# Patient Record
Sex: Female | Born: 1990 | Race: White | Hispanic: No | Marital: Single | Smoking: Never smoker
Health system: Southern US, Community
[De-identification: ages and names within clinical notes are randomized; demographics above are authoritative.]

## PROBLEM LIST (undated history)

## (undated) DIAGNOSIS — F909 Attention-deficit hyperactivity disorder, unspecified type: Secondary | ICD-10-CM

## (undated) DIAGNOSIS — I471 Supraventricular tachycardia, unspecified: Secondary | ICD-10-CM

## (undated) DIAGNOSIS — R42 Dizziness and giddiness: Secondary | ICD-10-CM

## (undated) DIAGNOSIS — N200 Calculus of kidney: Secondary | ICD-10-CM

---

## 2021-11-08 ENCOUNTER — Other Ambulatory Visit: Payer: Self-pay

## 2021-11-08 ENCOUNTER — Encounter (HOSPITAL_BASED_OUTPATIENT_CLINIC_OR_DEPARTMENT_OTHER): Payer: Self-pay

## 2021-11-08 ENCOUNTER — Emergency Department (HOSPITAL_BASED_OUTPATIENT_CLINIC_OR_DEPARTMENT_OTHER)
Admission: EM | Admit: 2021-11-08 | Discharge: 2021-11-08 | Disposition: A | Discharge status: Home / Self Care | Payer: 59 | Attending: Emergency Medicine | Admitting: Emergency Medicine

## 2021-11-08 ENCOUNTER — Emergency Department (HOSPITAL_BASED_OUTPATIENT_CLINIC_OR_DEPARTMENT_OTHER): Payer: 59

## 2021-11-08 DIAGNOSIS — R1031 Right lower quadrant pain: Secondary | ICD-10-CM | POA: Diagnosis present

## 2021-11-08 DIAGNOSIS — N201 Calculus of ureter: Secondary | ICD-10-CM | POA: Diagnosis not present

## 2021-11-08 HISTORY — DX: Calculus of kidney: N20.0

## 2021-11-08 LAB — CBC WITH DIFFERENTIAL/PLATELET
Abs Immature Granulocytes: 0.05 10*3/uL (ref 0.00–0.07)
Basophils Absolute: 0 10*3/uL (ref 0.0–0.1)
Basophils Relative: 0 %
Eosinophils Absolute: 0 10*3/uL (ref 0.0–0.5)
Eosinophils Relative: 0 %
HCT: 35.2 % — ABNORMAL LOW (ref 36.0–46.0)
Hemoglobin: 11.9 g/dL — ABNORMAL LOW (ref 12.0–15.0)
Immature Granulocytes: 0 %
Lymphocytes Relative: 7 %
Lymphs Abs: 0.8 10*3/uL (ref 0.7–4.0)
MCH: 29.5 pg (ref 26.0–34.0)
MCHC: 33.8 g/dL (ref 30.0–36.0)
MCV: 87.3 fL (ref 80.0–100.0)
Monocytes Absolute: 0.3 10*3/uL (ref 0.1–1.0)
Monocytes Relative: 2 %
Neutro Abs: 10.1 10*3/uL — ABNORMAL HIGH (ref 1.7–7.7)
Neutrophils Relative %: 91 %
Platelets: 253 10*3/uL (ref 150–400)
RBC: 4.03 MIL/uL (ref 3.87–5.11)
RDW: 12.2 % (ref 11.5–15.5)
WBC: 11.3 10*3/uL — ABNORMAL HIGH (ref 4.0–10.5)
nRBC: 0 % (ref 0.0–0.2)

## 2021-11-08 LAB — COMPREHENSIVE METABOLIC PANEL
ALT: 12 U/L (ref 0–44)
AST: 19 U/L (ref 15–41)
Albumin: 4 g/dL (ref 3.5–5.0)
Alkaline Phosphatase: 85 U/L (ref 38–126)
Anion gap: 8 (ref 5–15)
BUN: 19 mg/dL (ref 6–20)
CO2: 21 mmol/L — ABNORMAL LOW (ref 22–32)
Calcium: 8.8 mg/dL — ABNORMAL LOW (ref 8.9–10.3)
Chloride: 110 mmol/L (ref 98–111)
Creatinine, Ser: 0.96 mg/dL (ref 0.44–1.00)
GFR, Estimated: >60 mL/min (ref >60)
Glucose, Bld: 90 mg/dL (ref 70–99)
Potassium: 3.7 mmol/L (ref 3.5–5.1)
Sodium: 139 mmol/L (ref 135–145)
Total Bilirubin: 0.6 mg/dL (ref 0.3–1.2)
Total Protein: 6.9 g/dL (ref 6.5–8.1)

## 2021-11-08 LAB — URINALYSIS, ROUTINE W REFLEX MICROSCOPIC
Bilirubin Urine: NEGATIVE
Glucose, UA: NEGATIVE mg/dL
Ketones, ur: 15 mg/dL — AB
Leukocytes,Ua: NEGATIVE
Nitrite: NEGATIVE
Protein, ur: NEGATIVE mg/dL
Specific Gravity, Urine: >=1.03 (ref 1.005–1.030)
pH: 6 (ref 5.0–8.0)

## 2021-11-08 LAB — PREGNANCY, URINE: Preg Test, Ur: NEGATIVE

## 2021-11-08 LAB — URINALYSIS, MICROSCOPIC (REFLEX)

## 2021-11-08 LAB — LIPASE, BLOOD: Lipase: 28 U/L (ref 11–51)

## 2021-11-08 MED ORDER — SENNOSIDES-DOCUSATE SODIUM 8.6-50 MG PO TABS: 1.0000 | ORAL_TABLET | Freq: Every evening | ORAL | 0 refills | Status: AC | PRN

## 2021-11-08 MED ORDER — KETOROLAC TROMETHAMINE 30 MG/ML IJ SOLN
30.0000 mg | Freq: Once | INTRAMUSCULAR | Status: AC
  Administered 2021-11-08: 30 mg via INTRAVENOUS
  Filled 2021-11-08: qty 1

## 2021-11-08 MED ORDER — OXYCODONE-ACETAMINOPHEN 5-325 MG PO TABS: 1.0000 | ORAL_TABLET | Freq: Four times a day (QID) | ORAL | 0 refills | Status: AC | PRN

## 2021-11-08 MED ORDER — SODIUM CHLORIDE 0.9 % IV BOLUS
1000.0000 mL | Freq: Once | INTRAVENOUS | Status: AC
  Administered 2021-11-08: 1000 mL via INTRAVENOUS

## 2021-11-08 MED ORDER — HYDROMORPHONE HCL 1 MG/ML IJ SOLN
1.0000 mg | Freq: Once | INTRAMUSCULAR | Status: AC
  Administered 2021-11-08: 1 mg via INTRAVENOUS
  Filled 2021-11-08: qty 1

## 2021-11-08 MED ORDER — TAMSULOSIN HCL 0.4 MG PO CAPS: 0.4000 mg | ORAL_CAPSULE | Freq: Every day | ORAL | 0 refills | Status: AC

## 2021-11-08 MED ORDER — IOHEXOL 300 MG/ML  SOLN
100.0000 mL | Freq: Once | INTRAMUSCULAR | Status: AC | PRN
  Administered 2021-11-08: 100 mL via INTRAVENOUS

## 2021-11-08 MED ORDER — ONDANSETRON HCL 4 MG/2ML IJ SOLN
4.0000 mg | Freq: Once | INTRAMUSCULAR | Status: AC
  Administered 2021-11-08: 4 mg via INTRAVENOUS
  Filled 2021-11-08: qty 2

## 2021-11-08 MED ORDER — IBUPROFEN 800 MG PO TABS: 800.0000 mg | ORAL_TABLET | Freq: Three times a day (TID) | ORAL | 0 refills | Status: AC | PRN

## 2021-11-08 NOTE — ED Notes (Signed)
Pt resting in room, no complaints at this time

## 2021-11-08 NOTE — Discharge Instructions (Signed)

## 2021-11-08 NOTE — ED Triage Notes (Signed)
Pt reports right flank pain begins right lower abdomen radiates around to back beginning 3pm today.  Reports hx kidney stones.

## 2021-11-08 NOTE — ED Provider Notes (Signed)
Emergency Department Provider Note   I have reviewed the triage vital signs and the nursing notes.   HISTORY  Chief Complaint Flank Pain (Hx kidney stones)   HPI Heidi Wagner is a 30 y.o. female prior history of multiple kidney stones presents emergency department with right lower quadrant abdominal pain.  Symptoms began abruptly at 3 PM today and woke her from sleep.  No fevers or chills.  She notes some blood in the urine but notes she is on her menstrual cycle.  No fever.  Pain does radiate into the flank.  Pain is intermittently severe without obvious modifying factor.  Pain ultimately became severe to the point of requiring ED evaluation.   Past Medical History:  Diagnosis Date   Kidney stone     There are no problems to display for this patient.   History reviewed. No pertinent surgical history.  Allergies Patient has no known allergies.  History reviewed. No pertinent family history.  Social History Social History   Tobacco Use   Smoking status: Never   Smokeless tobacco: Never  Substance Use Topics   Alcohol use: Not Currently   Drug use: Not Currently    Review of Systems  Constitutional: No fever/chills Eyes: No visual changes. ENT: No sore throat. Cardiovascular: Denies chest pain. Respiratory: Denies shortness of breath. Gastrointestinal: Positive RLQ abdominal pain.  No nausea, no vomiting.  No diarrhea.  No constipation. Genitourinary: Negative for dysuria. Positive hematuria.  Musculoskeletal: Negative for back pain. Skin: Negative for rash. Neurological: Negative for headaches, focal weakness or numbness.  10-point ROS otherwise negative.  ____________________________________________   PHYSICAL EXAM:  VITAL SIGNS: ED Triage Vitals  Enc Vitals Group     BP 11/08/21 1828 (!) 98/58     Pulse Rate 11/08/21 1828 60     Resp 11/08/21 1828 16     Temp 11/08/21 1828 98 F (36.7 C)     Temp Source 11/08/21 1828 Oral     SpO2 11/08/21  1828 100 %     Weight 11/08/21 1825 120 lb (54.4 kg)     Height 11/08/21 1825 5\' 2"  (1.575 m)   Constitutional: Alert and oriented. Well appearing and in no acute distress. Eyes: Conjunctivae are normal.  Head: Atraumatic. Nose: No congestion/rhinnorhea. Mouth/Throat: Mucous membranes are moist.  Neck: No stridor.   Cardiovascular: Normal rate, regular rhythm. Good peripheral circulation. Grossly normal heart sounds.   Respiratory: Normal respiratory effort.  No retractions. Lungs CTAB. Gastrointestinal: Soft with focal, mild tenderness in the RLQ. No rebound or guarding. No focal tenderness in other abdominal quadrants. No distention.  Musculoskeletal: No lower extremity tenderness nor edema. No gross deformities of extremities. Neurologic:  Normal speech and language. No gross focal neurologic deficits are appreciated.  Skin:  Skin is warm, dry and intact. No rash noted.   ____________________________________________   LABS (all labs ordered are listed, but only abnormal results are displayed)  Labs Reviewed  URINE CULTURE - Abnormal; Notable for the following components:      Result Value   Culture MULTIPLE SPECIES PRESENT, SUGGEST RECOLLECTION (*)    All other components within normal limits  URINALYSIS, ROUTINE W REFLEX MICROSCOPIC - Abnormal; Notable for the following components:   Hgb urine dipstick MODERATE (*)    Ketones, ur 15 (*)    All other components within normal limits  URINALYSIS, MICROSCOPIC (REFLEX) - Abnormal; Notable for the following components:   Bacteria, UA FEW (*)    All other components within normal  limits  CBC WITH DIFFERENTIAL/PLATELET - Abnormal; Notable for the following components:   WBC 11.3 (*)    Hemoglobin 11.9 (*)    HCT 35.2 (*)    Neutro Abs 10.1 (*)    All other components within normal limits  COMPREHENSIVE METABOLIC PANEL - Abnormal; Notable for the following components:   CO2 21 (*)    Calcium 8.8 (*)    All other components  within normal limits  PREGNANCY, URINE  LIPASE, BLOOD  CBC WITH DIFFERENTIAL/PLATELET   ____________________________________________  RADIOLOGY  CT renal reviewed.   ____________________________________________   PROCEDURES  Procedure(s) performed:   Procedures  None ____________________________________________   INITIAL IMPRESSION / ASSESSMENT AND PLAN / ED COURSE  Pertinent labs & imaging results that were available during my care of the patient were reviewed by me and considered in my medical decision making (see chart for details).   Patient presents emergency department right lower quadrant abdominal pain with some focal tenderness.  Seems most consistent with kidney stone to the patient with her past presentations.  With focal tenderness, also considering acute appendicitis versus ovarian torsion with lower suspicion for these diagnoses.  Plan for CT with IV contrast and labs.  Differential diagnosis includes but is not exclusive to ectopic pregnancy, ovarian cyst, ovarian torsion, acute appendicitis, urinary tract infection, endometriosis, bowel obstruction, hernia, colitis, renal colic, gastroenteritis, volvulus etc.   Labs reviewed. No AKI. Minimal leukocytosis. UA with no clear evidence of UTI. Will send for culture. 26mm UVJ stone on the right corresponds with the patient's symptoms. Pain is well controlled here. Reviewed the [place] drug database and prescribed medication for pain and ureteral stone. Provided info for Urology follow up.  ____________________________________________  FINAL CLINICAL IMPRESSION(S) / ED DIAGNOSES  Final diagnoses:  Ureteral stone     MEDICATIONS GIVEN DURING THIS VISIT:  Medications  ondansetron (ZOFRAN) injection 4 mg (4 mg Intravenous Given 11/08/21 2039)  ketorolac (TORADOL) 30 MG/ML injection 30 mg (30 mg Intravenous Given 11/08/21 2038)  HYDROmorphone (DILAUDID) injection 1 mg (1 mg Intravenous Given 11/08/21 2039)  sodium  chloride 0.9 % bolus 1,000 mL (0 mLs Intravenous Stopped 11/08/21 2222)  iohexol (OMNIPAQUE) 300 MG/ML solution 100 mL (100 mLs Intravenous Contrast Given 11/08/21 2147)  HYDROmorphone (DILAUDID) injection 1 mg (1 mg Intravenous Given 11/08/21 2157)     NEW OUTPATIENT MEDICATIONS STARTED DURING THIS VISIT:  Discharge Medication List as of 11/08/2021 10:59 PM     START taking these medications   Details  ibuprofen (ADVIL) 800 MG tablet Take 1 tablet (800 mg total) by mouth every 8 (eight) hours as needed., Starting Sun 11/08/2021, Normal    oxyCODONE-acetaminophen (PERCOCET/ROXICET) 5-325 MG tablet Take 1 tablet by mouth every 6 (six) hours as needed for severe pain., Starting Sun 11/08/2021, Normal    senna-docusate (SENOKOT-S) 8.6-50 MG tablet Take 1 tablet by mouth at bedtime as needed for mild constipation., Starting Sun 11/08/2021, Normal    tamsulosin (FLOMAX) 0.4 MG CAPS capsule Take 1 capsule (0.4 mg total) by mouth daily for 14 days., Starting Sun 11/08/2021, Until Sun 11/22/2021, Normal        Note:  This document was prepared using Dragon voice recognition software and may include unintentional dictation errors.  Nanda Quinton, MD, [facility] Emergency Medicine    Anh Mangano, [facility], MD 11/12/21 [phone]

## 2021-11-09 LAB — URINE CULTURE

## 2022-10-18 ENCOUNTER — Emergency Department (HOSPITAL_BASED_OUTPATIENT_CLINIC_OR_DEPARTMENT_OTHER)
Admission: EM | Admit: 2022-10-18 | Discharge: 2022-10-18 | Disposition: A | Discharge status: Home / Self Care | Payer: 59 | Attending: Emergency Medicine | Admitting: Emergency Medicine

## 2022-10-18 ENCOUNTER — Other Ambulatory Visit: Payer: Self-pay

## 2022-10-18 ENCOUNTER — Encounter (HOSPITAL_BASED_OUTPATIENT_CLINIC_OR_DEPARTMENT_OTHER): Payer: Self-pay | Admitting: Emergency Medicine

## 2022-10-18 ENCOUNTER — Emergency Department (HOSPITAL_BASED_OUTPATIENT_CLINIC_OR_DEPARTMENT_OTHER): Payer: 59

## 2022-10-18 DIAGNOSIS — R1011 Right upper quadrant pain: Secondary | ICD-10-CM | POA: Insufficient documentation

## 2022-10-18 HISTORY — DX: Dizziness and giddiness: R42

## 2022-10-18 HISTORY — DX: Attention-deficit hyperactivity disorder, unspecified type: F90.9

## 2022-10-18 HISTORY — DX: Supraventricular tachycardia, unspecified: I47.10

## 2022-10-18 LAB — URINALYSIS, ROUTINE W REFLEX MICROSCOPIC
Bilirubin Urine: NEGATIVE
Glucose, UA: NEGATIVE mg/dL
Hgb urine dipstick: NEGATIVE
Ketones, ur: NEGATIVE mg/dL
Nitrite: NEGATIVE
Protein, ur: NEGATIVE mg/dL
Specific Gravity, Urine: 1.01 (ref 1.005–1.030)
pH: 6.5 (ref 5.0–8.0)

## 2022-10-18 LAB — CBC WITH DIFFERENTIAL/PLATELET
Abs Immature Granulocytes: 0.03 10*3/uL (ref 0.00–0.07)
Basophils Absolute: 0.1 10*3/uL (ref 0.0–0.1)
Basophils Relative: 1 %
Eosinophils Absolute: 0.1 10*3/uL (ref 0.0–0.5)
Eosinophils Relative: 2 %
HCT: 40.3 % (ref 36.0–46.0)
Hemoglobin: 13.3 g/dL (ref 12.0–15.0)
Immature Granulocytes: 0 %
Lymphocytes Relative: 29 %
Lymphs Abs: 2.1 10*3/uL (ref 0.7–4.0)
MCH: 29.7 pg (ref 26.0–34.0)
MCHC: 33 g/dL (ref 30.0–36.0)
MCV: 90 fL (ref 80.0–100.0)
Monocytes Absolute: 0.4 10*3/uL (ref 0.1–1.0)
Monocytes Relative: 5 %
Neutro Abs: 4.5 10*3/uL (ref 1.7–7.7)
Neutrophils Relative %: 63 %
Platelets: 180 10*3/uL (ref 150–400)
RBC: 4.48 MIL/uL (ref 3.87–5.11)
RDW: 12.1 % (ref 11.5–15.5)
WBC: 7.2 10*3/uL (ref 4.0–10.5)
nRBC: 0 % (ref 0.0–0.2)

## 2022-10-18 LAB — COMPREHENSIVE METABOLIC PANEL
ALT: 16 U/L (ref 0–44)
AST: 22 U/L (ref 15–41)
Albumin: 4.6 g/dL (ref 3.5–5.0)
Alkaline Phosphatase: 106 U/L (ref 38–126)
Anion gap: 8 (ref 5–15)
BUN: 14 mg/dL (ref 6–20)
CO2: 25 mmol/L (ref 22–32)
Calcium: 9.8 mg/dL (ref 8.9–10.3)
Chloride: 104 mmol/L (ref 98–111)
Creatinine, Ser: 0.81 mg/dL (ref 0.44–1.00)
GFR, Estimated: >60 mL/min (ref >60)
Glucose, Bld: 100 mg/dL — ABNORMAL HIGH (ref 70–99)
Potassium: 4.8 mmol/L (ref 3.5–5.1)
Sodium: 137 mmol/L (ref 135–145)
Total Bilirubin: 0.5 mg/dL (ref 0.3–1.2)
Total Protein: 7.9 g/dL (ref 6.5–8.1)

## 2022-10-18 LAB — PREGNANCY, URINE: Preg Test, Ur: NEGATIVE

## 2022-10-18 LAB — LIPASE, BLOOD: Lipase: 23 U/L (ref 11–51)

## 2022-10-18 MED ORDER — PANTOPRAZOLE SODIUM 20 MG PO TBEC
40.0000 mg | DELAYED_RELEASE_TABLET | Freq: Every day | ORAL | 0 refills | Status: AC
Start: 2022-10-18 — End: 2022-11-01

## 2022-10-18 NOTE — ED Provider Notes (Signed)
[facility] EMERGENCY DEPT  Provider Note  CSN: [id number] Arrival date & time: 10/18/22 0543  History Chief Complaint  Patient presents with   Abdominal Pain    Heidi Wagner is a 31 y.o. female with history of SVT and kidney stones, last seen for renal colic in Dec 2022 when a CT showed a single distal R ureteral stone which she states she passed without difficulty. She presents today for evaluation of 3 days of intermittent RUQ and R flank pain, no associated fever, vomiting, or dysuria.    Home Medications Prior to Admission medications   Medication Sig Start Date End Date Taking? Authorizing Provider  amphetamine-dextroamphetamine (ADDERALL XR) 20 MG 24 hr capsule Take 1 capsule by mouth every morning. 10/20/21   [provider]  amphetamine-dextroamphetamine (ADDERALL) 10 MG tablet Take 1 tablet by mouth daily. 10/20/21   [provider]  ibuprofen (ADVIL) 800 MG tablet Take 1 tablet (800 mg total) by mouth every 8 (eight) hours as needed. 11/08/21   Long, Arlyss Repress, MD  oxyCODONE-acetaminophen (PERCOCET/ROXICET) 5-325 MG tablet Take 1 tablet by mouth every 6 (six) hours as needed for severe pain. 11/08/21   Long, Arlyss Repress, MD  senna-docusate (SENOKOT-S) 8.6-50 MG tablet Take 1 tablet by mouth at bedtime as needed for mild constipation. 11/08/21   Long, Arlyss Repress, MD     Allergies    Patient has no known allergies.   Review of Systems   Review of Systems Please see HPI for pertinent positives and negatives  Physical Exam BP 127/77   Pulse 70   Temp 98.2 F (36.8 C) (Oral)   Resp 18   Ht 5\' 2"  (1.575 m)   Wt 54.4 kg   LMP 10/11/2022   SpO2 100%   BMI 21.94 kg/m   Physical Exam Vitals and nursing note reviewed.  Constitutional:      Appearance: Normal appearance.  HENT:     Head: Normocephalic and atraumatic.     Nose: Nose normal.     Mouth/Throat:     Mouth: Mucous membranes are moist.  Eyes:     Extraocular Movements:  Extraocular movements intact.     Conjunctiva/sclera: Conjunctivae normal.  Cardiovascular:     Rate and Rhythm: Normal rate.  Pulmonary:     Effort: Pulmonary effort is normal.     Breath sounds: Normal breath sounds.  Abdominal:     General: Abdomen is flat.     Palpations: Abdomen is soft.     Tenderness: There is abdominal tenderness in the right upper quadrant. There is no guarding. Negative signs include Murphy's sign and McBurney's sign.  Musculoskeletal:        General: No swelling. Normal range of motion.     Cervical back: Neck supple.  Skin:    General: Skin is warm and dry.  Neurological:     General: No focal deficit present.     Mental Status: She is alert.  Psychiatric:        Mood and Affect: Mood normal.     ED Results / Procedures / Treatments   EKG None  Procedures Procedures  Medications Ordered in the ED Medications - No data to display  Initial Impression and Plan  Patient here with intermittent RUQ and R flank pain for three days. Currently comfortable appearing, some RUQ tenderness but neg Murphy's sign. Will check labs, send for 10/13/2022 to evaluate gall bladder and to look for hydronephrosis. She declines pain medications at this time.  ED Course   Clinical Course as of 10/18/22 0707  Mon Oct 18, 2022  0614 UA is neg for infection or blood. HCG is neg.  [CS]  [id number] Care of the patient signed out to Dr. Billy Fischer at the change of shift pending labs and [place] results.  [CS]    Clinical Course User Index [CS] Truddie Hidden, MD     MDM Rules/Calculators/A&P Medical Decision Making Problems Addressed: RUQ pain: acute illness or injury  Amount and/or Complexity of Data Reviewed Labs: ordered. Decision-making details documented in ED Course. Radiology: ordered.    Final Clinical Impression(s) / ED Diagnoses Final diagnoses:  RUQ pain    Rx / DC Orders ED Discharge Orders     None        Truddie Hidden, MD 10/18/22 [phone]

## 2022-10-18 NOTE — ED Provider Notes (Signed)
  Physical Exam  BP 127/77   Pulse 62   Temp 98.2 F (36.8 C) (Oral)   Resp 18   Ht 5\' 2"  (1.575 m)   Wt 54.4 kg   LMP 10/11/2022   SpO2 98%   BMI 21.94 kg/m   Physical Exam  Procedures  Procedures  ED Course / MDM   Clinical Course as of 10/19/22 0009  Mon Oct 18, 2022  0614 UA is neg for infection or blood. HCG is neg.  [CS]  Oct 20, 2022 Care of the patient signed out to Dr. [id number] at the change of shift pending labs and Dalene Seltzer results.  [CS]    Clinical Course User Index [CS] [place], MD    Received care of patient at [facility]. See previous notes for hx, physical and care. Briefly, this is 31 yo female who presents with concern for RUQ abdominal pain.  Labs obtained and personally evaluated and interpreted by me with no sign of hepatitis or pancreatitis.  Abdominal 26 without hydronephrosis, hx not consistent with nephrolithiasis.  RUQ without gallstones or hepatitis.  Exam, hx not consistent with SBO or pelvic etiology of pain. No cp or dyspnea, doubt PE, pneumonia.    Recommend outpt follow up. Given protonix rx. Patient discharged in stable condition with understanding of reasons to return.          [place], MD 10/19/22 0010

## 2022-10-18 NOTE — ED Notes (Signed)
Patient verbalizes understanding of discharge instructions. Opportunity for questioning and answers were provided. Patient discharged from ED.  °

## 2022-10-18 NOTE — ED Triage Notes (Signed)
Pt c/o intermittent right sided abdominal/flank pain x 3 days.

## 2022-10-19 NOTE — ED Provider Notes (Incomplete)
  Physical Exam  BP 127/77   Pulse 62   Temp 98.2 F (36.8 C) (Oral)   Resp 18   Ht 5\' 2"  (1.575 m)   Wt 54.4 kg   LMP 10/11/2022   SpO2 98%   BMI 21.94 kg/m   Physical Exam  Procedures  Procedures  ED Course / MDM   Clinical Course as of 10/18/22 0740  Mon Oct 18, 2022  0614 UA is neg for infection or blood. HCG is neg.  [CS]  Oct 20, 2022 Care of the patient signed out to Dr. [id number] at the change of shift pending labs and Dalene Seltzer results.  [CS]    Clinical Course User Index [CS] [place], MD   Medical Decision Making Amount and/or Complexity of Data Reviewed Labs: ordered. Radiology: ordered.  Risk Prescription drug management.   ***

## 2023-11-18 IMAGING — CT CT ABD-PELV W/ CM
2 of 4 series · 16 of 46 positions shown, 18 images · IV contrast (Omnipaque)
Comparison: 08/09/2018

CLINICAL DATA: Right lower quadrant pain, right flank pain

EXAM:
CT ABDOMEN AND PELVIS WITH CONTRAST
TECHNIQUE: Multidetector CT imaging of the abdomen and pelvis was performed
using the standard protocol following bolus administration of
intravenous contrast.
CONTRAST:  100mL OMNIPAQUE IOHEXOL 300 MG/ML  SOLN

[Series 2: axial st · axial · 0.69mm/px · z∈[-642,-257]mm · 13 of 85 slices shown, 15 images]
[im 4/85  soft-tissue]
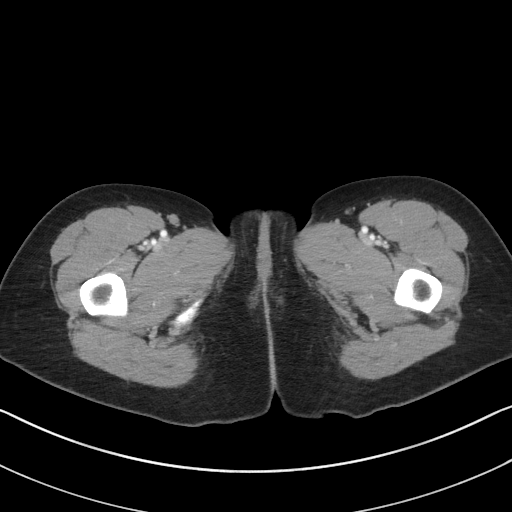
[im 4/85  bone]
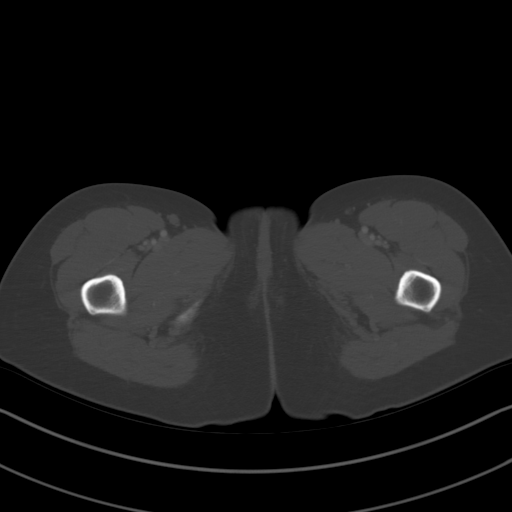
[im 11/85  soft-tissue]
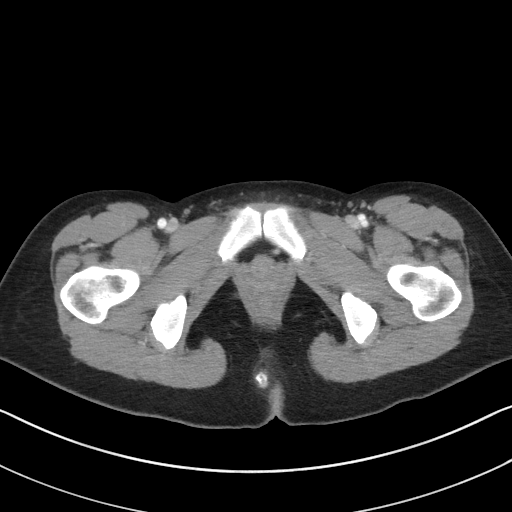
[im 17/85  soft-tissue]
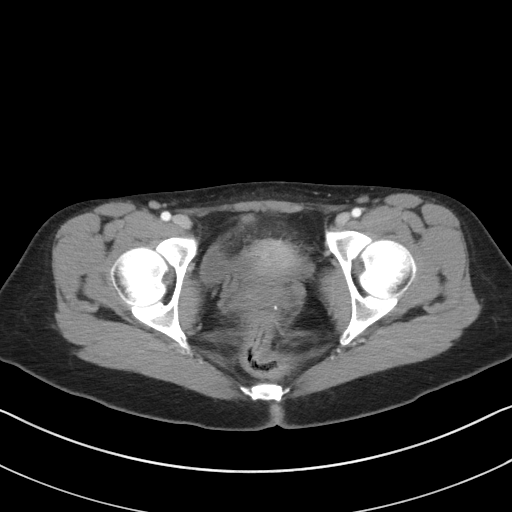
[im 24/85  soft-tissue]
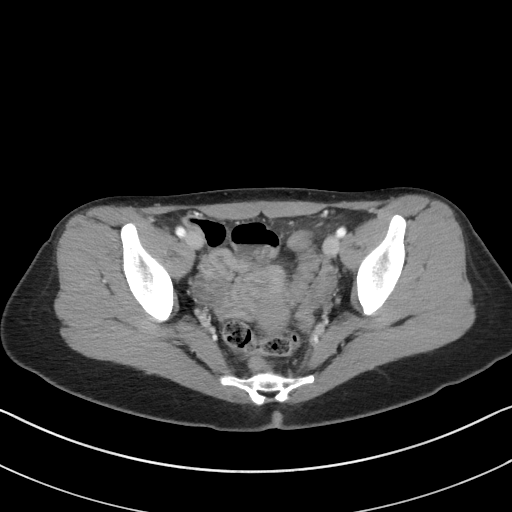
[im 31/85  soft-tissue]
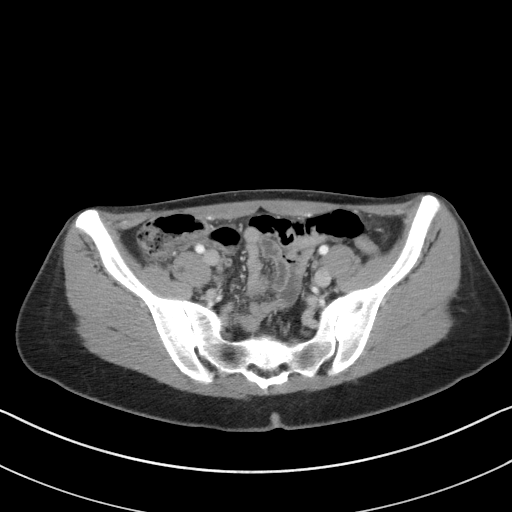
[im 37/85  soft-tissue]
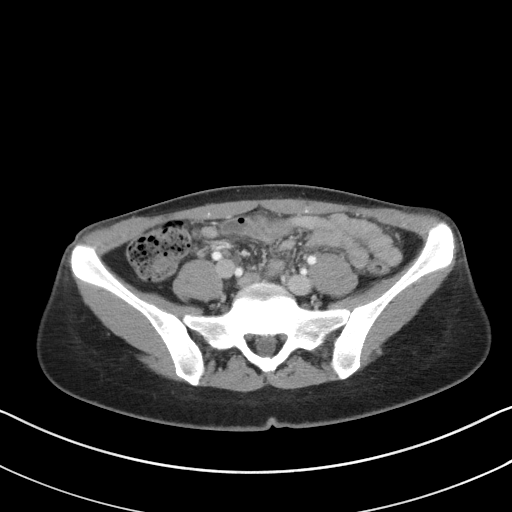
[im 44/85  soft-tissue]
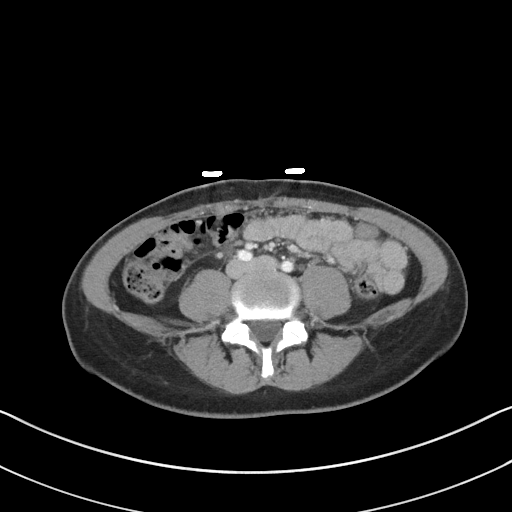
[im 48/85  soft-tissue]
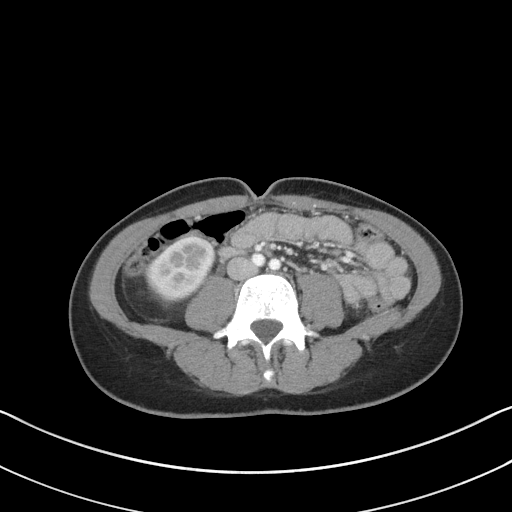
[im 54/85  soft-tissue]
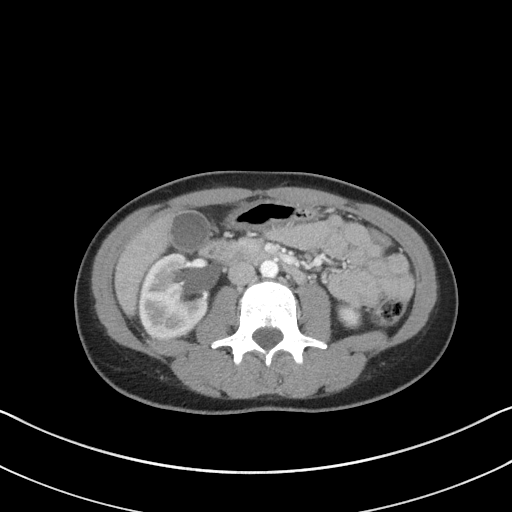
[im 54/85  bone]
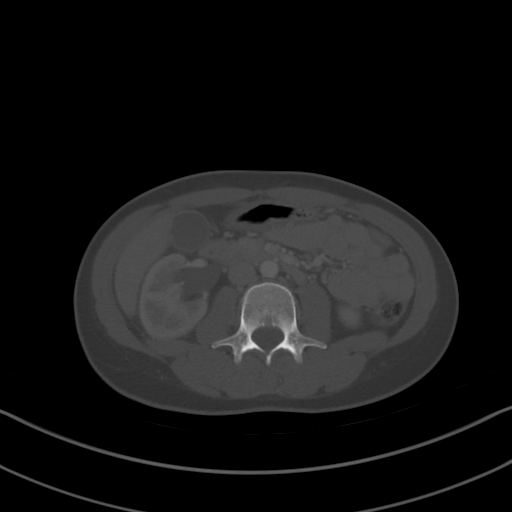
[im 61/85  soft-tissue]
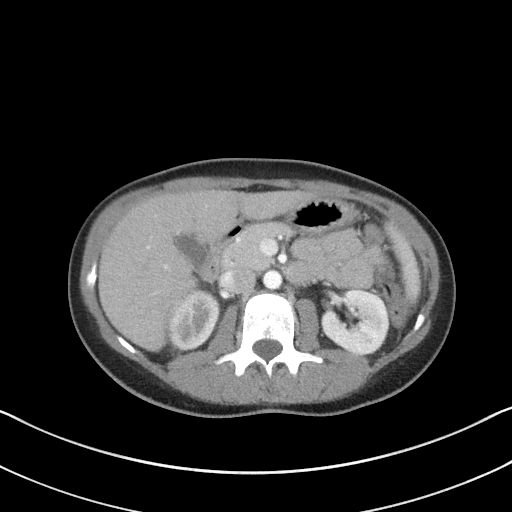
[im 68/85  soft-tissue]
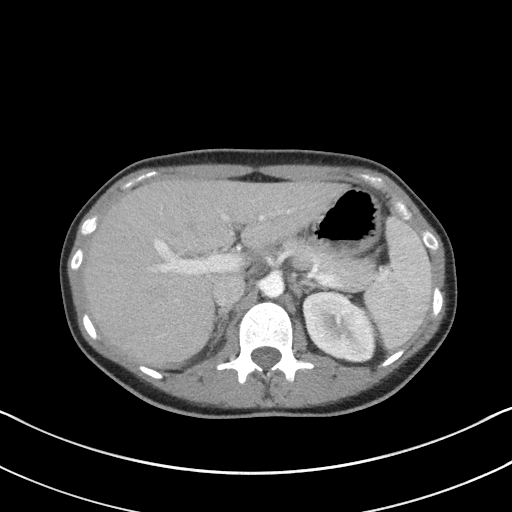
[im 74/85  soft-tissue]
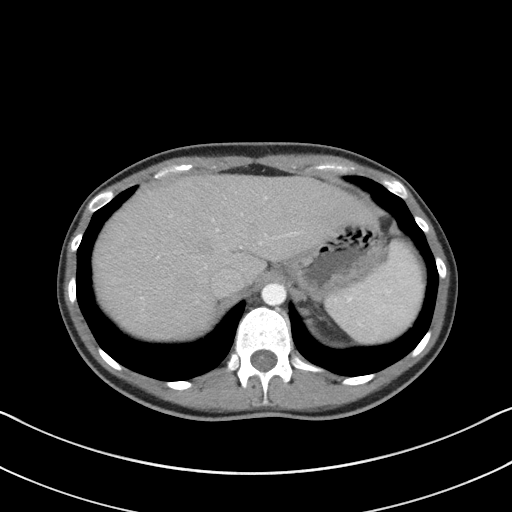
[im 81/85  soft-tissue]
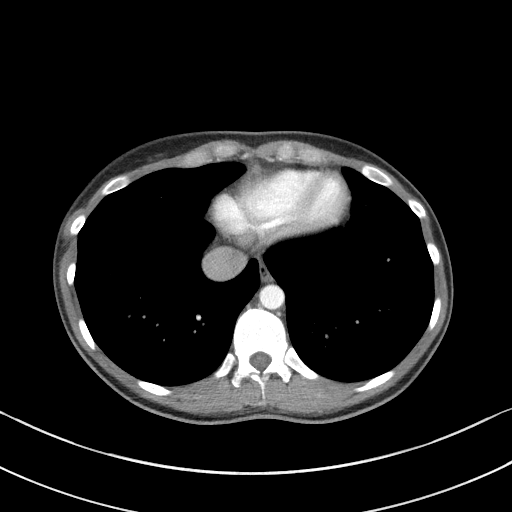

[Series 5: coronal st · coronal · 0.63mm/px · 3 of 74 slices shown]
[im 25/74  soft-tissue]
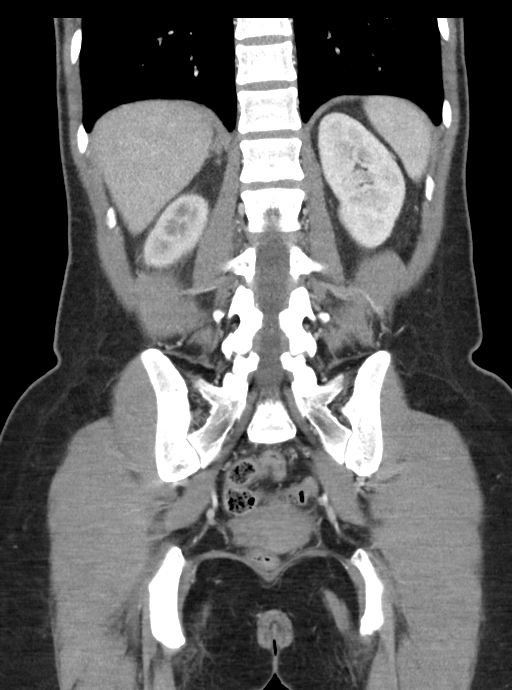
[im 33/74  soft-tissue]
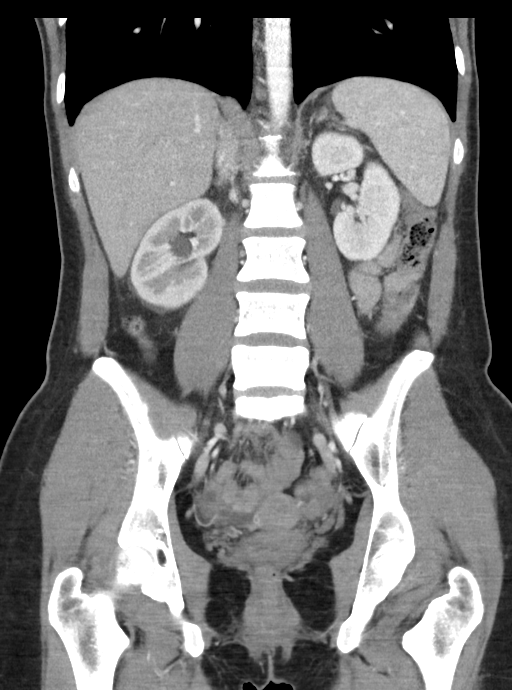
[im 41/74  soft-tissue]
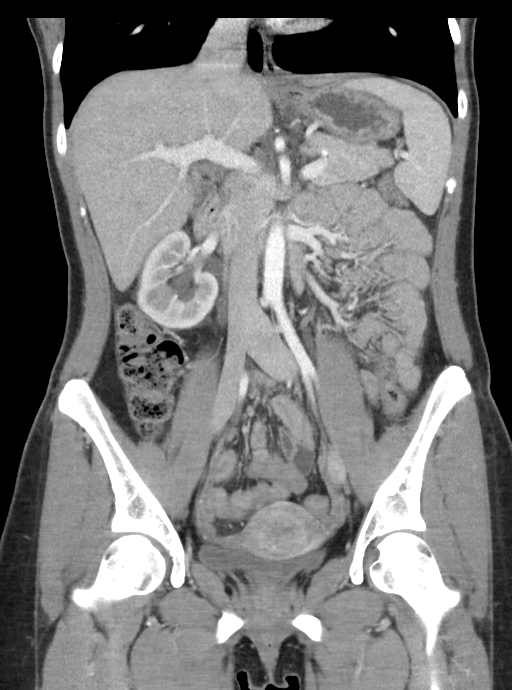

[16 of 46 positions shown; findings below may reference images not displayed]

FINDINGS: Lower chest: No acute abnormality

Hepatobiliary: No focal hepatic abnormality. Gallbladder
unremarkable.

Pancreas: No focal abnormality or ductal dilatation.

Spleen: No focal abnormality.  Normal size.

Adrenals/Urinary Tract: Right hydronephrosis due to 3 mm right UVJ
stone. No stones or hydronephrosis on the left. Adrenal glands and
urinary bladder unremarkable.

Stomach/Bowel: Normal appendix. Stomach, large and small bowel
grossly unremarkable.

Vascular/Lymphatic: No evidence of aneurysm or adenopathy.

Reproductive: Uterus and adnexa unremarkable.  No mass.

Other: No free fluid or free air.

Musculoskeletal: No acute bony abnormality.
IMPRESSION: 3 mm right UVJ stone with moderate right hydronephrosis.

## 2023-12-06 ENCOUNTER — Other Ambulatory Visit: Payer: Self-pay | Admitting: Family Medicine

## 2023-12-06 ENCOUNTER — Ambulatory Visit: Payer: Self-pay

## 2023-12-06 DIAGNOSIS — M25512 Pain in left shoulder: Secondary | ICD-10-CM
